# Patient Record
Sex: Male | Born: 1990 | Race: Black or African American | Hispanic: No | Marital: Single | Smoking: Current some day smoker
Health system: Southern US, Community
[De-identification: ages and names within clinical notes are randomized; demographics above are authoritative.]

## PROBLEM LIST (undated history)

## (undated) HISTORY — PX: MOUTH SURGERY: SHX715

---

## 2014-09-03 ENCOUNTER — Encounter (HOSPITAL_COMMUNITY): Payer: Self-pay | Admitting: Emergency Medicine

## 2014-09-03 ENCOUNTER — Emergency Department (HOSPITAL_COMMUNITY): Payer: Federal, State, Local not specified - PPO

## 2014-09-03 ENCOUNTER — Emergency Department (HOSPITAL_COMMUNITY)
Admission: EM | Admit: 2014-09-03 | Discharge: 2014-09-03 | Disposition: A | Discharge status: Home / Self Care | Payer: Federal, State, Local not specified - PPO | Attending: Emergency Medicine | Admitting: Emergency Medicine

## 2014-09-03 DIAGNOSIS — W51XXXA Accidental striking against or bumped into by another person, initial encounter: Secondary | ICD-10-CM | POA: Insufficient documentation

## 2014-09-03 DIAGNOSIS — Y998 Other external cause status: Secondary | ICD-10-CM | POA: Insufficient documentation

## 2014-09-03 DIAGNOSIS — Y92310 Basketball court as the place of occurrence of the external cause: Secondary | ICD-10-CM | POA: Diagnosis not present

## 2014-09-03 DIAGNOSIS — S8992XA Unspecified injury of left lower leg, initial encounter: Secondary | ICD-10-CM | POA: Diagnosis present

## 2014-09-03 DIAGNOSIS — Y9367 Activity, basketball: Secondary | ICD-10-CM | POA: Diagnosis not present

## 2014-09-03 DIAGNOSIS — S83005A Unspecified dislocation of left patella, initial encounter: Secondary | ICD-10-CM | POA: Insufficient documentation

## 2014-09-03 DIAGNOSIS — Z720 Tobacco use: Secondary | ICD-10-CM | POA: Insufficient documentation

## 2014-09-03 DIAGNOSIS — M25562 Pain in left knee: Secondary | ICD-10-CM

## 2014-09-03 MED ORDER — MELOXICAM 7.5 MG PO TABS: 15.0000 mg | ORAL_TABLET | Freq: Every day | ORAL | Status: AC

## 2014-09-03 NOTE — ED Provider Notes (Signed)
CSN: [id number]     Arrival date & time 09/03/14  1946 History  This chart was scribed for non-physician practitioner, Antony MaduraKelly Caly Pellum, PA-C,working with Mirian MoMatthew Gentry, MD, by Karle PlumberJennifer Tensley, ED Scribe. This patient was seen in room WTR9/WTR9 and the patient's care was started at 8:46 PM.  Chief Complaint  Patient presents with  . Knee Injury   The history is provided by the patient. No language interpreter was used.    HPI Comments:  Ronnie Cole is a 24 y.o. male who presents to the Emergency Department complaining of a left knee injury that occurred approximately 1.5 hours ago. Pt states he was playing basketball and jumped up, colliding with another player. He states he thinks he dislocated it and reduced it himself by "just moving it". He reports severe throbbing pain to the medial aspect. He has not taken anything for pain. He states he cannot bear weight and if he tries it increases the pain. Also, moving the knee makes the pain worse. Denies alleviating factors. Denies head injury, numbness, tingling or weakness of the LLE, wounds, bruising or swelling, nausea or vomiting.  History reviewed. No pertinent past medical history. Past Surgical History  Procedure Laterality Date  . Mouth surgery     Family History  Problem Relation Age of Onset  . Hypertension Other   . Diabetes Other   . Cancer Other    History  Substance Use Topics  . Smoking status: Current Some Day Smoker    Types: Cigars  . Smokeless tobacco: Not on file  . Alcohol Use: Yes     Comment: occ    Review of Systems  Gastrointestinal: Negative for nausea and vomiting.  Musculoskeletal: Positive for arthralgias. Negative for joint swelling.  Skin: Negative for color change and wound.  Neurological: Negative for weakness and numbness.  All other systems reviewed and are negative.   Allergies  Review of patient's allergies indicates no known allergies.  Home Medications   Prior to Admission medications    Medication Sig Start Date End Date Taking? Authorizing Provider  meloxicam (MOBIC) 7.5 MG tablet Take 2 tablets (15 mg total) by mouth daily. 09/03/14   Antony MaduraKelly Jimmy Stipes, PA-C   Triage Vitals: BP 151/64 mmHg  Pulse 60  Temp(Src) 97.4 F (36.3 C) (Oral)  Resp 18  SpO2 99%  Physical Exam  Constitutional: He is oriented to person, place, and time. He appears well-developed and well-nourished. No distress.  Nontoxic/nonseptic appearing  HENT:  Head: Normocephalic and atraumatic.  Eyes: Conjunctivae and EOM are normal. No scleral icterus.  Neck: Normal range of motion.  Cardiovascular: Normal rate, regular rhythm and intact distal pulses.   DP and PT pulses 2+ in LLE  Pulmonary/Chest: Effort normal. No respiratory distress.  Respirations even and unlabored  Musculoskeletal: Normal range of motion.       Left knee: He exhibits normal range of motion, no effusion, no deformity, no erythema, normal alignment, no LCL laxity and no MCL laxity. Tenderness found. Medial joint line, lateral joint line and patellar tendon tenderness noted.  Normal PROM of L knee. TTP along the medial and lateral joint line of the L knee as well as inferior to the L patella. Normal patellar mobility. No deformity or significant effusion. No erythema or heat to touch.  Neurological: He is alert and oriented to person, place, and time. He exhibits normal muscle tone. Coordination normal.  Patellar and achilles reflexes 2+ in LLE. Sensation to light touch intact.  Skin: Skin is warm  and dry. No rash noted. He is not diaphoretic. No erythema. No pallor.  Psychiatric: He has a normal mood and affect. His behavior is normal.  Nursing note and vitals reviewed.   ED Course  Procedures (including critical care time) DIAGNOSTIC STUDIES: Oxygen Saturation is 99% on RA, normal by my interpretation.   COORDINATION OF CARE: 8:52 PM- Will order knee sleeve and refer to orthopedist. Advised pt to RICE knee and will prescribe  NSAID. Pt verbalizes understanding and agrees to plan.  Medications - No data to display  Labs Review Labs Reviewed - No data to display  Imaging Review Dg Knee Complete 4 Views Left  09/03/2014   CLINICAL DATA:  Knee injury playing basketball.  Dislocated kneecap.  EXAM: LEFT KNEE - COMPLETE 4+ VIEW  COMPARISON:  None.  FINDINGS: The left knee demonstrates no acute fracture or dislocation. There is a small joint effusion.  IMPRESSION: No acute osseous injury of the left knee.  Small joint effusion.   Electronically Signed   By: Elige Ko   On: 09/03/2014 20:39     EKG Interpretation None      MDM   Final diagnoses:  Patellar dislocation, left, initial encounter  Knee pain, left    24 year old male presents to the emergency department after a presumed patellar dislocation with reduction manually prior to arrival. Patient neurovascularly intact. No bony deformity or crepitus appreciated. Tenderness is noted to the medial and lateral joint line of the left knee as well as inferior to the patella. No evidence of septic joint. X-ray negative for fracture or dislocation. There is a small joint effusion noted. Patient placed in knee sleeve and given crutches. He has been instructed not to bear weight on his left leg and was otherwise instructed by an orthopedic specialist. Referral provided as well as Mobic for pain/inflammation. RICE discussed and return precautions provided. Patient agreeable to plan with no unaddressed concerns.  I personally performed the services described in this documentation, which was scribed in my presence. The recorded information has been reviewed and is accurate.   Filed Vitals:   09/03/14 1955  BP: 151/64  Pulse: 60  Temp: 97.4 F (36.3 C)  TempSrc: Oral  Resp: 18  SpO2: 99%     Antony Madura, PA-C 09/03/14 2109  Mirian Mo, MD 09/05/14 [id number]

## 2014-09-03 NOTE — ED Notes (Signed)
Pt states he was playing basketball and injured his left knee  Pt states he dislocated his knee cap but it popped back in place  Pt is c/o pain with movement

## 2014-09-03 NOTE — Discharge Instructions (Signed)
Wear a knee sleeve for stability. Use crutches to prevent from putting weight on your left leg unless otherwise instructed by an orthopedic specialist. Follow-up with an orthopedic doctor as soon as possible. Take Mobic as prescribed for pain and inflammation. Ice your knee 3-4 times per day as instructed.  RICE: Routine Care for Injuries The routine care of many injuries includes Rest, Ice, Compression, and Elevation (RICE). HOME CARE INSTRUCTIONS  Rest is needed to allow your body to heal. Routine activities can usually be resumed when comfortable. Injured tendons and bones can take up to 6 weeks to heal. Tendons are the cord-like structures that attach muscle to bone.  Ice following an injury helps keep the swelling down and reduces pain.  Put ice in a plastic bag.  Place a towel between your skin and the bag.  Leave the ice on for 15-20 minutes, 3-4 times a day, or as directed by your health care provider. Do this while awake, for the first 24 to 48 hours. After that, continue as directed by your caregiver.  Compression helps keep swelling down. It also gives support and helps with discomfort. If an elastic bandage has been applied, it should be removed and reapplied every 3 to 4 hours. It should not be applied tightly, but firmly enough to keep swelling down. Watch fingers or toes for swelling, bluish discoloration, coldness, numbness, or excessive pain. If any of these problems occur, remove the bandage and reapply loosely. Contact your caregiver if these problems continue.  Elevation helps reduce swelling and decreases pain. With extremities, such as the arms, hands, legs, and feet, the injured area should be placed near or above the level of the heart, if possible. SEEK IMMEDIATE MEDICAL CARE IF:  You have persistent pain and swelling.  You develop redness, numbness, or unexpected weakness.  Your symptoms are getting worse rather than improving after several days. These symptoms may  indicate that further evaluation or further X-rays are needed. Sometimes, X-rays may not show a small broken bone (fracture) until 1 week or 10 days later. Make a follow-up appointment with your caregiver. Ask when your X-ray results will be ready. Make sure you get your X-ray results. Document Released: 11/20/2000 Document Revised: 08/13/2013 Document Reviewed: 01/07/2011 [facility] Patient Information 2015 [place], [place]. This information is not intended to replace advice given to you by your health care provider. Make sure you discuss any questions you have with your health care provider.  Patellar Dislocation A patellar dislocation occurs when your kneecap (patella) slips out of its normal position in a groove in front of the lower end of your thighbone (femur). This groove is called the patellofemoral groove.  CAUSES The kneecap is normally positioned over the front of the knee joint at the base of the thighbone. A kneecap can be dislocated when:  The kneecap is out of place (patellar tracking disorder), and force is applied.  The foot is firmly planted pointing outward, and the knee bends with the thigh turned inward. This kind of injury is common during many sports activities.  The inner edge of the kneecap is hit, pushing it toward the outer side of the leg. SIGNS AND SYMPTOMS  Severe pain.  A misshapen knee that looks like a bone is out of position.  A popping sensation, followed by a feeling that something is out of place.  Inability to bend or straighten the knee.  Knee swelling.  Cool, pale skin or numbness and tingling in or below the affected knee.  DIAGNOSIS  Your health care provider will physically examine the injured area. An X-ray exam may be done to make sure a bone fracture has not occurred. In some cases, your health care provider may look inside your knee joint with an instrument much like a pencil-sized telescope (arthroscope). This may be done to make sure you have  no loose cartilage in your joint. Loose cartilage is not visible on an X-ray image. TREATMENT  In many instances, the patella can be guided back into position without much difficulty. It often goes back into position by straightening the leg. Often, nothing more may be needed other than a brief period of immobilization followed by the exercises your health care provider recommends. If patellar dislocation starts to become frequent after the first incident, surgery may be needed to prevent your patella from slipping out of place. HOME CARE INSTRUCTIONS   Only take over-the-counter or prescription medicines for pain, discomfort, or fever as directed by your health care provider.  Use a knee brace if directed to do so by your health care provider.  Use crutches as instructed.  Apply ice to the injured knee:  Put ice in a plastic bag.  Place a towel between your skin and the bag.  Leave the ice on for 20 minutes, 2-3 times a day.  Follow your health care provider's instructions for doing any recommended range-of-motion exercises or other exercises. SEEK IMMEDIATE MEDICAL CARE IF:  You have increased pain or swelling in the knee that is not relieved with medicine.  You have increasing inflammation in the knee.  You have locking or catching of your knee. MAKE SURE YOU:  Understand these instructions.  Will watch your condition.  Will get help right away if you are not doing well or get worse. Document Released: 05/03/2001 Document Revised: 05/29/2013 Document Reviewed: 03/20/2013 [facility] Patient Information 2015 [place], [place]. This information is not intended to replace advice given to you by your health care provider. Make sure you discuss any questions you have with your health care provider.

## 2015-12-25 IMAGING — CR DG KNEE COMPLETE 4+V*L*
4 series · 4 of 4 positions shown · non-contrast
Comparison: None.

CLINICAL DATA: Knee injury playing basketball.  Dislocated kneecap.

EXAM:
LEFT KNEE - COMPLETE 4+ VIEW

[t knee ap left]
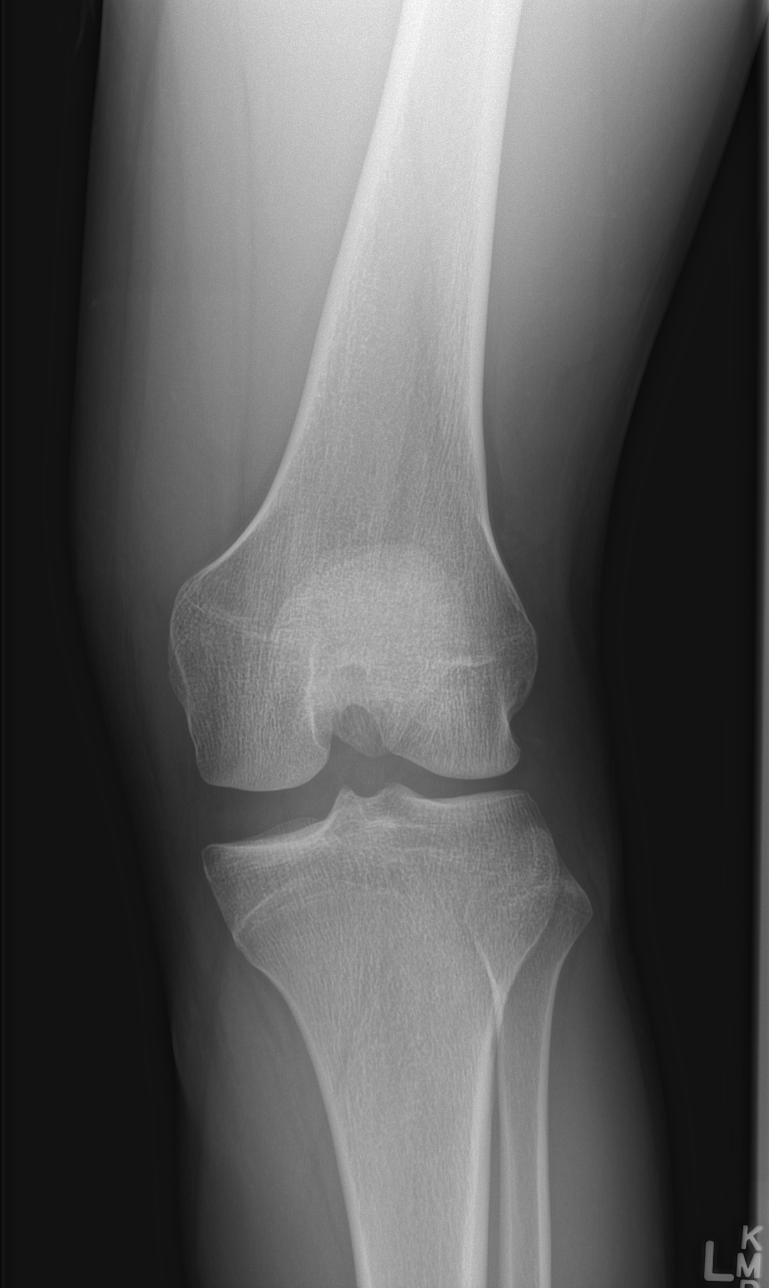

[t knee obl left (1 of 2)]
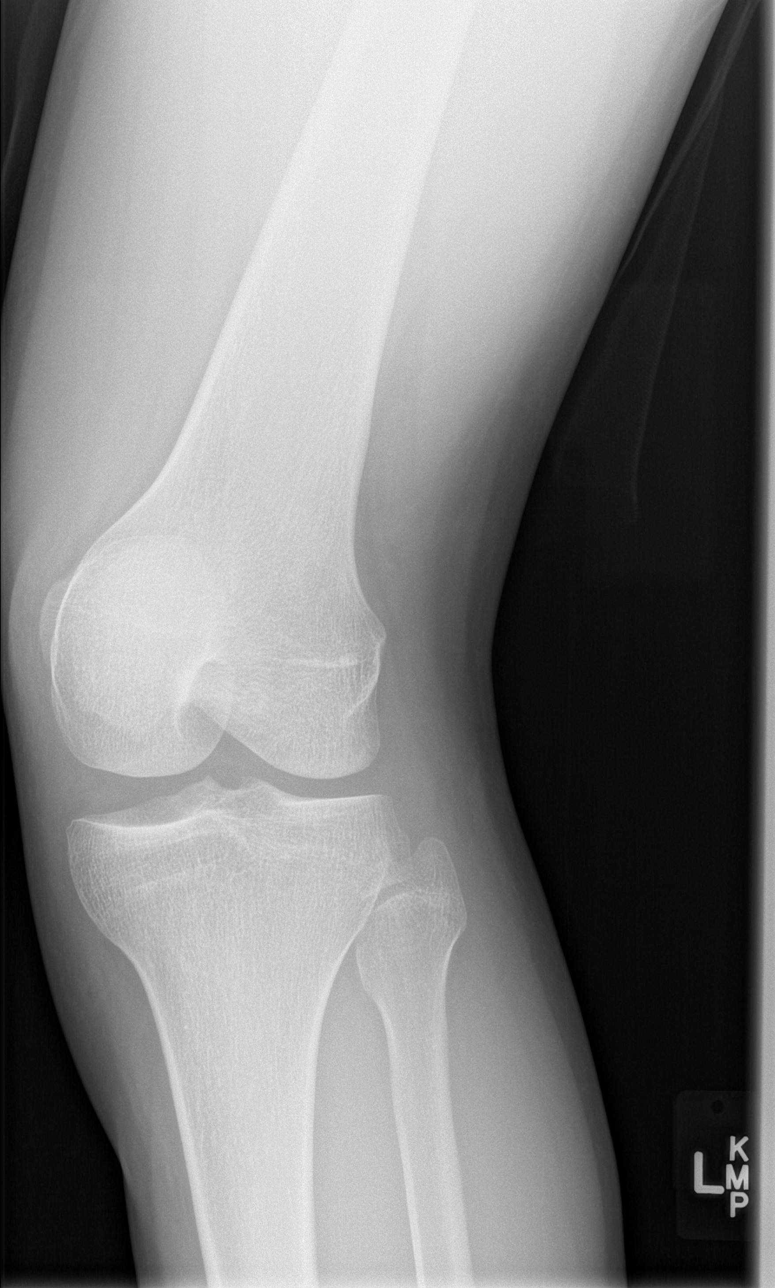

[t knee obl left (2 of 2)]
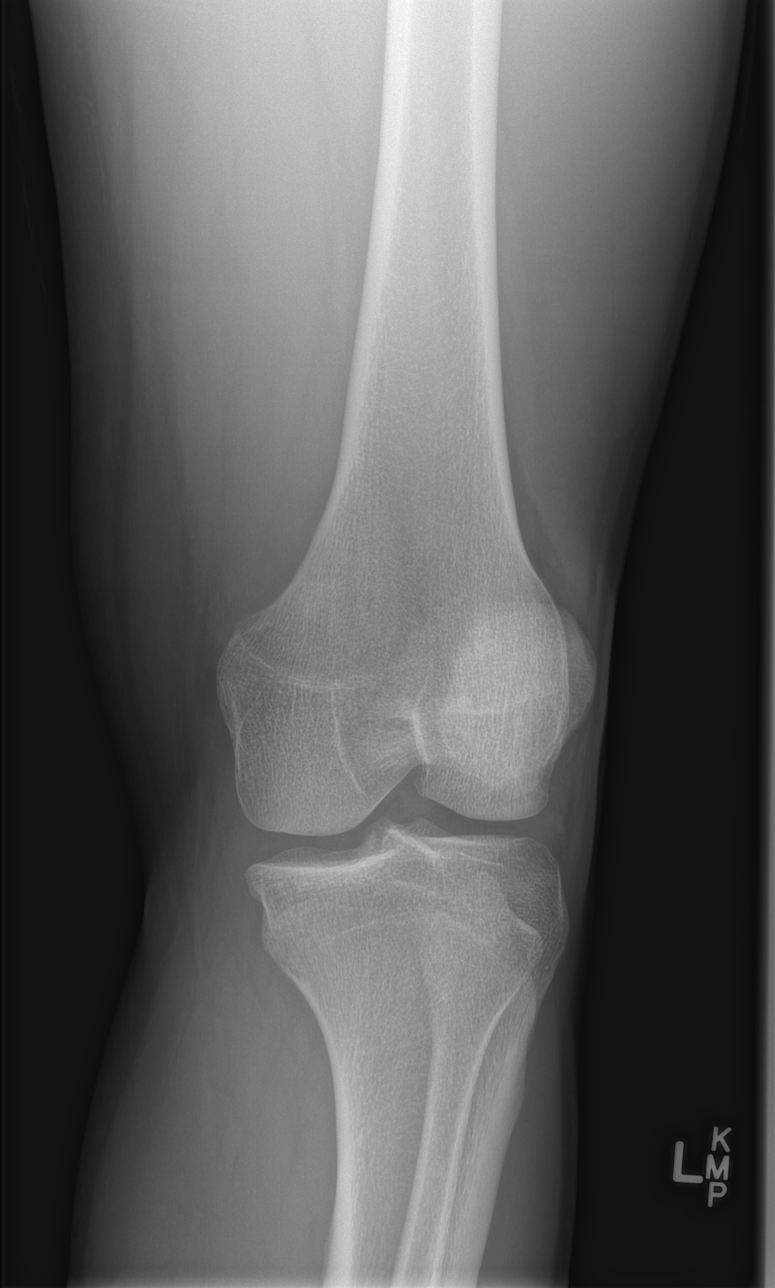

[t knee lat left]
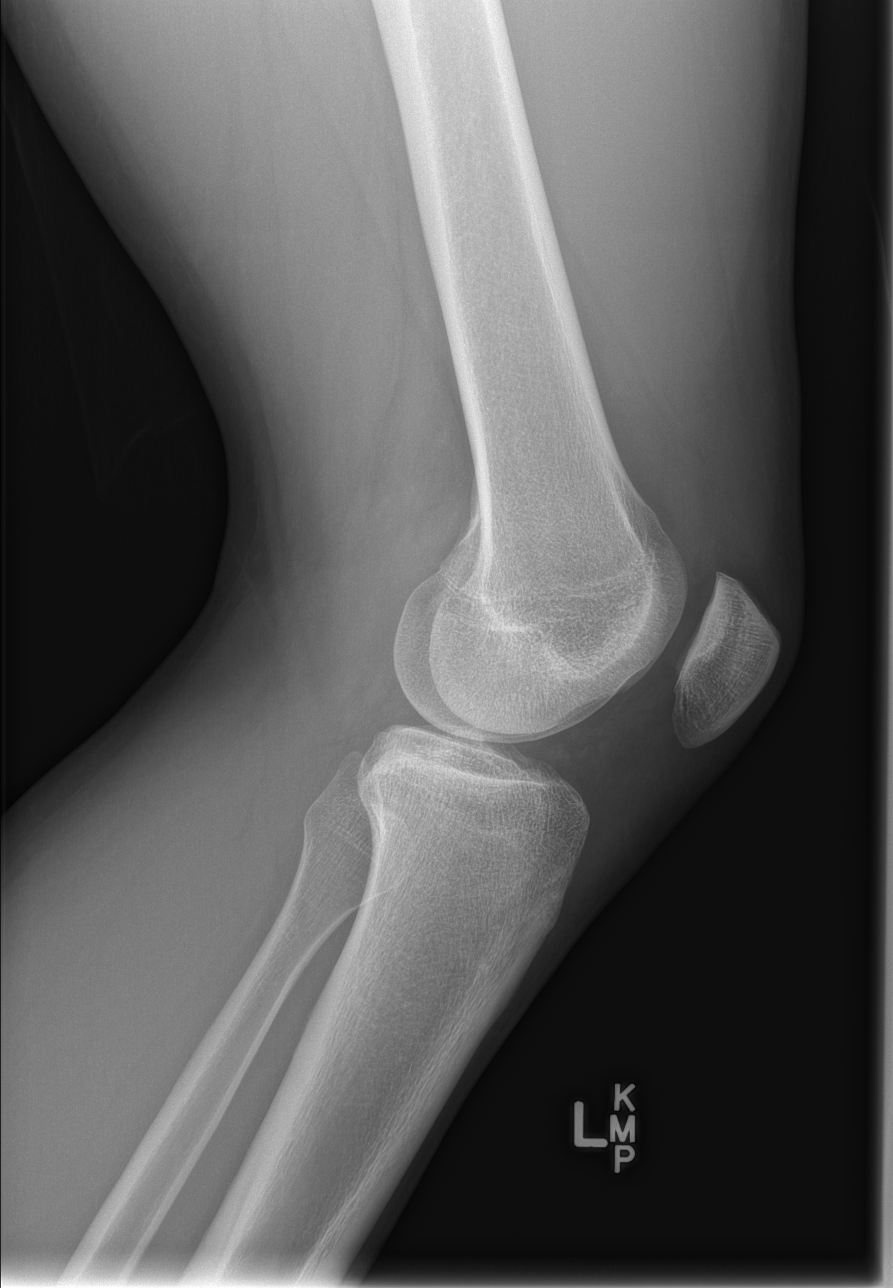

[4 of 4 positions shown; findings below may reference images not displayed]

FINDINGS: The left knee demonstrates no acute fracture or dislocation. There
is a small joint effusion.
IMPRESSION: No acute osseous injury of the left knee.  Small joint effusion.
